# Patient Record
Sex: Female | Born: 1950 | Race: White | Hispanic: No | Marital: Single | Smoking: Never smoker
Health system: Southern US, Community
[De-identification: ages and names within clinical notes are randomized; demographics above are authoritative.]

## PROBLEM LIST (undated history)

## (undated) DIAGNOSIS — E785 Hyperlipidemia, unspecified: Secondary | ICD-10-CM

## (undated) DIAGNOSIS — C4491 Basal cell carcinoma of skin, unspecified: Secondary | ICD-10-CM

## (undated) DIAGNOSIS — R0602 Shortness of breath: Secondary | ICD-10-CM

## (undated) DIAGNOSIS — R221 Localized swelling, mass and lump, neck: Secondary | ICD-10-CM

## (undated) DIAGNOSIS — E559 Vitamin D deficiency, unspecified: Secondary | ICD-10-CM

## (undated) DIAGNOSIS — R030 Elevated blood-pressure reading, without diagnosis of hypertension: Secondary | ICD-10-CM

## (undated) DIAGNOSIS — R195 Other fecal abnormalities: Secondary | ICD-10-CM

## (undated) HISTORY — DX: Elevated blood-pressure reading, without diagnosis of hypertension: R03.0

## (undated) HISTORY — DX: Shortness of breath: R06.02

## (undated) HISTORY — DX: Basal cell carcinoma of skin, unspecified: C44.91

## (undated) HISTORY — DX: Hyperlipidemia, unspecified: E78.5

## (undated) HISTORY — DX: Vitamin D deficiency, unspecified: E55.9

## (undated) HISTORY — DX: Localized swelling, mass and lump, neck: R22.1

## (undated) HISTORY — DX: Other fecal abnormalities: R19.5

---

## 1992-06-20 HISTORY — PX: TUBAL LIGATION: SHX77

## 2005-12-02 DIAGNOSIS — E7800 Pure hypercholesterolemia, unspecified: Secondary | ICD-10-CM | POA: Insufficient documentation

## 2005-12-02 DIAGNOSIS — R0602 Shortness of breath: Secondary | ICD-10-CM

## 2005-12-02 HISTORY — DX: Shortness of breath: R06.02

## 2007-12-03 ENCOUNTER — Ambulatory Visit: Payer: Self-pay | Admitting: Gastroenterology

## 2007-12-03 LAB — HM COLONOSCOPY

## 2011-07-29 LAB — HM MAMMOGRAPHY

## 2012-12-27 DIAGNOSIS — E041 Nontoxic single thyroid nodule: Secondary | ICD-10-CM | POA: Insufficient documentation

## 2012-12-28 ENCOUNTER — Ambulatory Visit: Payer: Self-pay | Admitting: Otolaryngology

## 2013-05-03 LAB — HM PAP SMEAR: HM PAP: NEGATIVE

## 2013-05-09 LAB — TSH: TSH: 0.97 u[IU]/mL (ref 0.41–5.90)

## 2013-05-09 LAB — CBC AND DIFFERENTIAL
HEMATOCRIT: 40 % (ref 36–46)
Hemoglobin: 13.6 g/dL (ref 12.0–16.0)
Platelets: 319 10*3/uL (ref 150–399)
WBC: 3.8 10^3/mL

## 2013-05-09 LAB — BASIC METABOLIC PANEL
BUN: 13 mg/dL (ref 4–21)
CREATININE: 0.9 mg/dL (ref 0.5–1.1)
GLUCOSE: 87 mg/dL
Potassium: 4 mmol/L (ref 3.4–5.3)
Sodium: 143 mmol/L (ref 137–147)

## 2013-10-29 LAB — LIPID PANEL
CHOLESTEROL: 191 mg/dL (ref 0–200)
HDL: 59 mg/dL (ref 35–70)
LDL Cholesterol: 111 mg/dL
TRIGLYCERIDES: 103 mg/dL (ref 40–160)

## 2013-10-29 LAB — HEPATIC FUNCTION PANEL
ALT: 13 U/L (ref 7–35)
AST: 17 U/L (ref 13–35)

## 2014-06-25 ENCOUNTER — Ambulatory Visit: Payer: Self-pay | Admitting: Podiatry

## 2015-03-09 ENCOUNTER — Other Ambulatory Visit: Payer: Self-pay | Admitting: Otolaryngology

## 2015-03-09 DIAGNOSIS — E041 Nontoxic single thyroid nodule: Secondary | ICD-10-CM

## 2015-03-12 ENCOUNTER — Ambulatory Visit
Admission: RE | Admit: 2015-03-12 | Discharge: 2015-03-12 | Disposition: A | Discharge status: Home / Self Care | Payer: 59 | Source: Ambulatory Visit | Attending: Otolaryngology | Admitting: Otolaryngology

## 2015-03-12 DIAGNOSIS — E042 Nontoxic multinodular goiter: Secondary | ICD-10-CM | POA: Insufficient documentation

## 2015-03-12 DIAGNOSIS — E041 Nontoxic single thyroid nodule: Secondary | ICD-10-CM

## 2015-03-24 DIAGNOSIS — Z8619 Personal history of other infectious and parasitic diseases: Secondary | ICD-10-CM | POA: Insufficient documentation

## 2015-03-24 DIAGNOSIS — C4490 Unspecified malignant neoplasm of skin, unspecified: Secondary | ICD-10-CM | POA: Insufficient documentation

## 2015-03-24 DIAGNOSIS — C4491 Basal cell carcinoma of skin, unspecified: Secondary | ICD-10-CM | POA: Insufficient documentation

## 2015-03-24 DIAGNOSIS — E559 Vitamin D deficiency, unspecified: Secondary | ICD-10-CM | POA: Insufficient documentation

## 2015-03-24 DIAGNOSIS — R799 Abnormal finding of blood chemistry, unspecified: Secondary | ICD-10-CM | POA: Insufficient documentation

## 2015-03-24 DIAGNOSIS — R195 Other fecal abnormalities: Secondary | ICD-10-CM | POA: Insufficient documentation

## 2015-03-24 DIAGNOSIS — R030 Elevated blood-pressure reading, without diagnosis of hypertension: Secondary | ICD-10-CM | POA: Insufficient documentation

## 2015-03-24 DIAGNOSIS — R221 Localized swelling, mass and lump, neck: Secondary | ICD-10-CM | POA: Insufficient documentation

## 2015-03-24 DIAGNOSIS — Z0130 Encounter for examination of blood pressure without abnormal findings: Secondary | ICD-10-CM | POA: Insufficient documentation

## 2015-03-27 ENCOUNTER — Ambulatory Visit (INDEPENDENT_AMBULATORY_CARE_PROVIDER_SITE_OTHER): Payer: 59 | Admitting: Physician Assistant

## 2015-03-27 ENCOUNTER — Encounter: Payer: Self-pay | Admitting: Physician Assistant

## 2015-03-27 VITALS — BP 150/88 | HR 68 | Temp 97.8°F | Resp 16 | Ht 63.5 in | Wt 142.0 lb

## 2015-03-27 DIAGNOSIS — Z1239 Encounter for other screening for malignant neoplasm of breast: Secondary | ICD-10-CM

## 2015-03-27 DIAGNOSIS — Z1211 Encounter for screening for malignant neoplasm of colon: Secondary | ICD-10-CM | POA: Diagnosis not present

## 2015-03-27 DIAGNOSIS — E7800 Pure hypercholesterolemia, unspecified: Secondary | ICD-10-CM | POA: Diagnosis not present

## 2015-03-27 DIAGNOSIS — Z0000 Encounter for general adult medical examination without abnormal findings: Secondary | ICD-10-CM | POA: Diagnosis not present

## 2015-03-27 LAB — IFOBT (OCCULT BLOOD): IFOBT: NEGATIVE

## 2015-03-27 NOTE — Progress Notes (Signed)
Patient: Lori Rivers, Female    DOB: [birth year], 63 y.o.   MRN: [id number] Visit Date: 03/27/2015  Today's Provider: Mar Daring, PA-C   Chief Complaint  Patient presents with  . Annual Exam   Subjective:    Annual physical exam Lori Rivers is a 64 y.o. female who presents today for health maintenance and complete physical. She feels well. She reports exercising, walks daily for 20-30 minutes. She reports she is sleeping well, 6-8 hours.  Patient currently not taking any cholesterol medicines. Patient is doing a modified diet, low sugar, very healthy diet. Patient check blood pressure 120/74 30 minutes prior to appointment.  Last PCP: 05/03/14 Pap Smear: 05/03/14 Normal; HPV and G/C Negative Mammogram: Normal 06/03/13 BMD: Normal 06/03/13  There is no personal or family history of cervical or ovarian cancer. Pap smear is to be repeated in 2018. Mammogram was normal in 2014. She is due for repeat annual mammogram. She does perform monthly self breast exams. There is no family history of breast cancer. Last colonoscopy was normal. She is due for colonoscopy in 2019. There is a positive family history of colon cancer in her brother, however is thought to be secondary due to his alcoholism.   Review of Systems  Constitutional: Negative.   HENT: Negative.   Eyes: Negative.   Respiratory: Negative.   Cardiovascular: Negative.   Gastrointestinal: Negative.   Endocrine: Negative.   Genitourinary: Negative.   Musculoskeletal: Negative.   Skin: Negative.   Allergic/Immunologic: Negative.   Neurological: Negative.   Hematological: Negative.   Psychiatric/Behavioral: Negative.     Social History She  reports that she has never smoked. She has never used smokeless tobacco. She reports that she does not drink alcohol or use illicit drugs. Social History   Social History  . Marital Status: Single    Spouse Name: N/A  . Number of Children: 2  . Years of  Education: [employer]   Social History Main Topics  . Smoking status: Never Smoker   . Smokeless tobacco: Never Used  . Alcohol Use: No  . Drug Use: No  . Sexual Activity: Not on file   Other Topics Concern  . Not on file   Social History Narrative  . No narrative on file    Patient Active Problem List   Diagnosis Date Noted  . Avitaminosis D 03/24/2015  . History of chicken pox 03/24/2015  . Encounter for examination of blood pressure without abnormal findings 03/24/2015  . Malignant neoplasm of skin 03/24/2015  . Abnormal blood chemistry 03/24/2015  . Vitamin D deficiency   . Carcinoma of the skin, basal cell   . Blood pressure elevated without history of HTN   . Occult blood positive stool   . Neck nodule   . Thyroid nodule 12/27/2012  . Accumulation of fluid in tissues 09/25/2009  . Short of breath on exertion 12/02/2005  . Hypercholesteremia 12/02/2005    Past Surgical History  Procedure Laterality Date  . Tubal ligation  1994    Family History  Family Status  Relation Status Death Age  . Mother Deceased 84    Cerebrovascular Accident,  . Father Deceased 54   Her family history includes Alcohol abuse in her father and mother; Depression in her mother; Diabetes in her father; Hypertension in her father and mother.    Allergies not on file  Previous Medications   CHOLECALCIFEROL (VITAMIN D3) 5000 UNITS TABS    Take 1 tablet  by mouth daily.   OMEPRAZOLE (PRILOSEC) 10 MG CAPSULE    Take 1 capsule by mouth daily.   SIMVASTATIN (ZOCOR) 20 MG TABLET    Take 1 tablet by mouth at bedtime.    Patient Care Team: Mar Daring, PA-C as PCP - General (Physician Assistant)     Objective:   Vitals: There were no vitals taken for this visit.   Physical Exam  Constitutional: She is oriented to person, place, and time. She appears well-developed and well-nourished. No distress.  HENT:  Head: Normocephalic and atraumatic.  Right Ear: Hearing, tympanic  membrane, external ear and ear canal normal.  Left Ear: Hearing, tympanic membrane, external ear and ear canal normal.  Nose: Nose normal.  Mouth/Throat: Uvula is midline, oropharynx is clear and moist and mucous membranes are normal. No oropharyngeal exudate.  Eyes: Conjunctivae and EOM are normal. Pupils are equal, round, and reactive to light. Right eye exhibits no discharge. Left eye exhibits no discharge. No scleral icterus.  Neck: Normal range of motion. Neck supple. No JVD present. Carotid bruit is not present. No tracheal deviation present. Thyroid mass (stable thyroid nodule-follwed by Dr. Richardson Landry) present. No thyromegaly present.  Cardiovascular: Normal rate, regular rhythm, normal heart sounds and intact distal pulses.  Exam reveals no gallop and no friction rub.   No murmur heard. Pulmonary/Chest: Effort normal and breath sounds normal. No respiratory distress. She has no wheezes. She has no rales. She exhibits no tenderness. Right breast exhibits no inverted nipple, no mass, no nipple discharge, no skin change and no tenderness. Left breast exhibits no inverted nipple, no mass, no nipple discharge, no skin change and no tenderness. Breasts are symmetrical.  Abdominal: Soft. Bowel sounds are normal. She exhibits no distension and no mass. There is no tenderness. There is no rebound and no guarding. Hernia confirmed negative in the right inguinal area and confirmed negative in the left inguinal area.  Genitourinary: Rectum normal, vagina normal and uterus normal. Rectal exam shows no external hemorrhoid, no internal hemorrhoid, no fissure, no mass, no tenderness and anal tone normal. Guaiac negative stool. No breast swelling, tenderness, discharge or bleeding. Pelvic exam was performed with patient supine. There is no rash, tenderness, lesion or injury on the right labia. There is no rash, tenderness, lesion or injury on the left labia. Cervix exhibits no motion tenderness, no discharge and no  friability. Right adnexum displays no mass, no tenderness and no fullness. Left adnexum displays no mass, no tenderness and no fullness. No erythema, tenderness or bleeding in the vagina. No signs of injury around the vagina. No vaginal discharge found.  Musculoskeletal: Normal range of motion. She exhibits no edema or tenderness.  Lymphadenopathy:    She has no cervical adenopathy.       Right: No inguinal adenopathy present.       Left: No inguinal adenopathy present.  Neurological: She is alert and oriented to person, place, and time. She has normal reflexes. No cranial nerve deficit. Coordination normal.  Skin: Skin is warm and dry. No rash noted. She is not diaphoretic.  Psychiatric: She has a normal mood and affect. Her behavior is normal. Judgment and thought content normal.  Vitals reviewed.    Depression Screen No flowsheet data found.    Assessment & Plan:     Routine Health Maintenance and Physical Exam  1. Annual physical exam Physical exam today was normal. We will check labs as below and follow-up pending lab results or in one year  for repeat annual physical exam if labs are normal.she does have a known thyroid nodule. She has had [facility] ultrasound of this. Most recent labs done by Dr. Richardson Landry were normal. - CBC with Differential - Lipid panel - Comprehensive metabolic panel - Mammogram Digital Screening; Future  2. Breast cancer screening Breast exam today was normal. She does perform self breast exams monthly. Order for mammogram has been placed and she is to [facility] her  Annual screening mammogram. - Mammogram Digital Screening; Future  3. Hypercholesteremia History of hypercholesterolemia. Was previously on simvastatin but discontinued. She is trying to control her cholesterol with lifestyle changes. I will recheck her lipid panel and follow-up pending lab results. - Lipid panel  4. Colon cancer screening Normal exam today. OC-Lite was  negative. Repeat screening colonoscopy will be in 2019. - IFOBT POC (occult bld, rslt in office)   Exercise Activities and Dietary recommendations Goals    None      Immunization History  Administered Date(s) Administered  . Tdap 11/02/2007    Health Maintenance  Topic Date Due  . Hepatitis C Screening  1950/12/23  . HIV Screening  06/16/1967  . ZOSTAVAX  06/15/2012  . MAMMOGRAM  07/28/2013  . INFLUENZA VACCINE  01/19/2015  . PAP SMEAR  05/03/2016  . TETANUS/TDAP  11/01/2017  . COLONOSCOPY  12/02/2017      Discussed health benefits of physical activity, and encouraged her to engage in regular exercise appropriate for her age and condition.    --------------------------------------------------------------------

## 2015-03-27 NOTE — Patient Instructions (Signed)
Health Maintenance, Female Adopting a healthy lifestyle and getting preventive care can go a long way to promote health and wellness. Talk with your health care provider about what schedule of regular examinations is right for you. This is a good chance for you to check in with your provider about disease prevention and staying healthy. In between checkups, there are plenty of things you can do on your own. Experts have done a lot of research about which lifestyle changes and preventive measures are most likely to keep you healthy. Ask your health care provider for more information. WEIGHT AND DIET  Eat a healthy diet 1. Be sure to include plenty of vegetables, fruits, low-fat dairy products, and lean protein. 2. Do not eat a lot of foods high in solid fats, added sugars, or salt. 3. Get regular exercise. This is one of the most important things you can do for your health. 1. Most adults should exercise for at least 150 minutes each week. The exercise should increase your heart rate and make you sweat (moderate-intensity exercise). 2. Most adults should also do strengthening exercises at least twice a week. This is in addition to the moderate-intensity exercise.  Maintain a healthy weight 1. Body mass index (BMI) is a measurement that can be used to identify possible weight problems. It estimates body fat based on height and weight. Your health care provider can help determine your BMI and help you achieve or maintain a healthy weight. 2. For females 87 years of age and older:  1. A BMI below 18.5 is considered underweight. 2. A BMI of 18.5 to 24.9 is normal. 3. A BMI of 25 to 29.9 is considered overweight. 4. A BMI of 30 and above is considered obese.  Watch levels of cholesterol and blood lipids 1. You should start having your blood tested for lipids and cholesterol at 63 years of age, then have this test every 5 years. 2. You may need to have your cholesterol levels checked more often  if: 1. Your lipid or cholesterol levels are high. 2. You are older than 63 years of age. 3. You are at high risk for heart disease.  CANCER SCREENING   Lung Cancer 1. Lung cancer screening is recommended for adults 87-65 years old who are at high risk for lung cancer because of a history of smoking. 2. A yearly low-dose CT scan of the lungs is recommended for people who: 1. Currently smoke. 2. Have quit within the past 15 years. 3. Have at least a 30-pack-year history of smoking. A pack year is smoking an average of one pack of cigarettes a day for 1 year. 3. Yearly screening should continue until it has been 15 years since you quit. 4. Yearly screening should stop if you develop a health problem that would prevent you from having lung cancer treatment.  Breast Cancer  Practice breast self-awareness. This means understanding how your breasts normally appear and feel.  It also means doing regular breast self-exams. Let your health care provider know about any changes, no matter how small.  If you are in your 20s or 30s, you should have a clinical breast exam (CBE) by a health care provider every 1-3 years as part of a regular health exam.  If you are 72 or older, have a CBE every year. Also consider having a breast X-ray (mammogram) every year.  If you have a family history of breast cancer, talk to your health care provider about genetic screening.  If you  are at high risk for breast cancer, talk to your health care provider about having an MRI and a mammogram every year.  Breast cancer gene (BRCA) assessment is recommended for women who have family members with BRCA-related cancers. BRCA-related cancers include:  Breast.  Ovarian.  Tubal.  Peritoneal cancers.  Results of the assessment will determine the need for genetic counseling and BRCA1 and BRCA2 testing. Cervical Cancer Your health care provider may recommend that you be screened regularly for cancer of the pelvic  organs (ovaries, uterus, and vagina). This screening involves a pelvic examination, including checking for microscopic changes to the surface of your cervix (Pap test). You may be encouraged to have this screening done every 3 years, beginning at age 23.  For women ages 38-65, health care providers may recommend pelvic exams and Pap testing every 3 years, or they may recommend the Pap and pelvic exam, combined with testing for human papilloma virus (HPV), every 5 years. Some types of HPV increase your risk of cervical cancer. Testing for HPV may also be done on women of any age with unclear Pap test results.  Other health care providers may not recommend any screening for nonpregnant women who are considered low risk for pelvic cancer and who do not have symptoms. Ask your health care provider if a screening pelvic exam is right for you.  If you have had past treatment for cervical cancer or a condition that could lead to cancer, you need Pap tests and screening for cancer for at least 20 years after your treatment. If Pap tests have been discontinued, your risk factors (such as having a new sexual partner) need to be reassessed to determine if screening should resume. Some women have medical problems that increase the chance of getting cervical cancer. In these cases, your health care provider may recommend more frequent screening and Pap tests. Colorectal Cancer  This type of cancer can be detected and often prevented.  Routine colorectal cancer screening usually begins at 63 years of age and continues through 63 years of age.  Your health care provider may recommend screening at an earlier age if you have risk factors for colon cancer.  Your health care provider may also recommend using home test kits to check for hidden blood in the stool.  A small camera at the end of a tube can be used to examine your colon directly (sigmoidoscopy or colonoscopy). This is done to check for the earliest forms  of colorectal cancer.  Routine screening usually begins at age 17.  Direct examination of the colon should be repeated every 5-10 years through 63 years of age. However, you may need to be screened more often if early forms of precancerous polyps or small growths are found. Skin Cancer  Check your skin from head to toe regularly.  Tell your health care provider about any new moles or changes in moles, especially if there is a change in a mole's shape or color.  Also tell your health care provider if you have a mole that is larger than the size of a pencil eraser.  Always use sunscreen. Apply sunscreen liberally and repeatedly throughout the day.  Protect yourself by wearing long sleeves, pants, a wide-brimmed hat, and sunglasses whenever you are outside. HEART DISEASE, DIABETES, AND HIGH BLOOD PRESSURE   High blood pressure causes heart disease and increases the risk of stroke. High blood pressure is more likely to develop in:  People who have blood pressure in the high end  of the normal range (130-139/85-89 mm Hg).  People who are overweight or obese.  People who are African American.  If you are 85-42 years of age, have your blood pressure checked every 3-5 years. If you are 20 years of age or older, have your blood pressure checked every year. You should have your blood pressure measured twice--once when you are at a hospital or clinic, and once when you are not at a hospital or clinic. Record the average of the two measurements. To check your blood pressure when you are not at a hospital or clinic, you can use:  An automated blood pressure machine at a pharmacy.  A home blood pressure monitor.  If you are between 19 years and 53 years old, ask your health care provider if you should take aspirin to prevent strokes.  Have regular diabetes screenings. This involves taking a blood sample to check your fasting blood sugar level.  If you are at a normal weight and have a low risk  for diabetes, have this test once every three years after 63 years of age.  If you are overweight and have a high risk for diabetes, consider being tested at a younger age or more often. PREVENTING INFECTION  Hepatitis B  If you have a higher risk for hepatitis B, you should be screened for this virus. You are considered at high risk for hepatitis B if:  You were born in a country where hepatitis B is common. Ask your health care provider which countries are considered high risk.  Your parents were born in a high-risk country, and you have not been immunized against hepatitis B (hepatitis B vaccine).  You have HIV or AIDS.  You use needles to inject street drugs.  You live with someone who has hepatitis B.  You have had sex with someone who has hepatitis B.  You get hemodialysis treatment.  You take certain medicines for conditions, including cancer, organ transplantation, and autoimmune conditions. Hepatitis C  Blood testing is recommended for:  Everyone born from [birth year] through 1964.  Anyone with known risk factors for hepatitis C. Sexually transmitted infections (STIs)  You should be screened for sexually transmitted infections (STIs) including gonorrhea and chlamydia if:  You are sexually active and are younger than 63 years of age.  You are older than 63 years of age and your health care provider tells you that you are at risk for this type of infection.  Your sexual activity has changed since you were last screened and you are at an increased risk for chlamydia or gonorrhea. Ask your health care provider if you are at risk.  If you do not have HIV, but are at risk, it may be recommended that you take a prescription medicine daily to prevent HIV infection. This is called pre-exposure prophylaxis (PrEP). You are considered at risk if:  You are sexually active and do not regularly use condoms or know the HIV status of your partner(s).  You take drugs by injection.  You  are sexually active with a partner who has HIV. Talk with your health care provider about whether you are at high risk of being infected with HIV. If you choose to begin PrEP, you should first be tested for HIV. You should then be tested every 3 months for as long as you are taking PrEP.  PREGNANCY   If you are premenopausal and you may become pregnant, ask your health care provider about preconception counseling.  If you may  become pregnant, take 400 to 800 micrograms (mcg) of folic acid every day.  If you want to prevent pregnancy, talk to your health care provider about birth control (contraception). OSTEOPOROSIS AND MENOPAUSE   Osteoporosis is a disease in which the bones lose minerals and strength with aging. This can result in serious bone fractures. Your risk for osteoporosis can be identified using a bone density scan.  If you are 65 years of age or older, or if you are at risk for osteoporosis and fractures, ask your health care provider if you should be screened.  Ask your health care provider whether you should take a calcium or vitamin D supplement to lower your risk for osteoporosis.  Menopause may have certain physical symptoms and risks.  Hormone replacement therapy may reduce some of these symptoms and risks. Talk to your health care provider about whether hormone replacement therapy is right for you.  HOME CARE INSTRUCTIONS   Schedule regular health, dental, and eye exams.  Stay current with your immunizations.   Do not use any tobacco products including cigarettes, chewing tobacco, or electronic cigarettes.  If you are pregnant, do not drink alcohol.  If you are breastfeeding, limit how much and how often you drink alcohol.  Limit alcohol intake to no more than 1 drink per day for nonpregnant women. One drink equals 12 ounces of beer, 5 ounces of wine, or 1 ounces of hard liquor.  Do not use street drugs.  Do not share needles.  Ask your health care provider  for help if you need support or information about quitting drugs.  Tell your health care provider if you often feel depressed.  Tell your health care provider if you have ever been abused or do not feel safe at home.   This information is not intended to replace advice given to you by your health care provider. Make sure you discuss any questions you have with your health care provider.   Document Released: 12/20/2010 Document Revised: 06/27/2014 Document Reviewed: 05/08/2013 Elsevier Interactive Patient Education [employer].     Why follow it? Research shows. . Those who follow the Mediterranean diet have a reduced risk of heart disease  . The diet is associated with a reduced incidence of Parkinson's and Alzheimer's diseases . People following the diet may have longer life expectancies and lower rates of chronic diseases  . The Dietary Guidelines for Americans recommends the Mediterranean diet as an eating plan to promote health and prevent disease  What Is the Mediterranean Diet?  . Healthy eating plan based on typical foods and recipes of Mediterranean-style cooking . The diet is primarily a plant based diet; these foods should make up a majority of meals   Starches - Plant based foods should make up a majority of meals - They are an important sources of vitamins, minerals, energy, antioxidants, and fiber - Choose whole grains, foods high in fiber and minimally processed items  - Typical grain sources include wheat, oats, barley, corn, brown rice, bulgar, farro, millet, polenta, couscous  - Various types of beans include chickpeas, lentils, fava beans, black beans, white beans   Fruits  Veggies - Large quantities of antioxidant rich fruits & veggies; 6 or more servings  - Vegetables can be eaten raw or lightly drizzled with oil and cooked  - Vegetables common to the traditional Mediterranean Diet include: artichokes, arugula, beets, broccoli, brussel sprouts, cabbage, carrots,  celery, collard greens, cucumbers, eggplant, kale, leeks, lemons, lettuce, mushrooms, okra, onions,  peas, peppers, potatoes, pumpkin, radishes, rutabaga, shallots, spinach, sweet potatoes, turnips, zucchini - Fruits common to the Mediterranean Diet include: apples, apricots, avocados, cherries, clementines, dates, figs, grapefruits, grapes, melons, nectarines, oranges, peaches, pears, pomegranates, strawberries, tangerines  Fats - Replace butter and margarine with healthy oils, such as olive oil, canola oil, and tahini  - Limit nuts to no more than a handful a day  - Nuts include walnuts, almonds, pecans, pistachios, pine nuts  - Limit or avoid candied, honey roasted or heavily salted nuts - Olives are central to the Mediterranean diet - can be eaten whole or used in a variety of dishes   Meats Protein - Limiting red meat: no more than a few times a month - When eating red meat: choose lean cuts and keep the portion to the size of deck of cards - Eggs: approx. 0 to 4 times a week  - Fish and lean poultry: at least 2 a week  - Healthy protein sources include, chicken, turkey, lean beef, lamb - Increase intake of seafood such as tuna, salmon, trout, mackerel, shrimp, scallops - Avoid or limit high fat processed meats such as sausage and bacon  Dairy - Include moderate amounts of low fat dairy products  - Focus on healthy dairy such as fat free yogurt, skim milk, low or reduced fat cheese - Limit dairy products higher in fat such as whole or 2% milk, cheese, ice cream  Alcohol - Moderate amounts of red wine is ok  - No more than 5 oz daily for women (all ages) and men older than age 65  - No more than 10 oz of wine daily for men younger than 65  Other - Limit sweets and other desserts  - Use herbs and spices instead of salt to flavor foods  - Herbs and spices common to the traditional Mediterranean Diet include: basil, bay leaves, chives, cloves, cumin, fennel, garlic, lavender, marjoram, mint,  oregano, parsley, pepper, rosemary, sage, savory, sumac, tarragon, thyme   It's not just a diet, it's a lifestyle:  . The Mediterranean diet includes lifestyle factors typical of those in the region  . Foods, drinks and meals are best eaten with others and savored . Daily physical activity is important for overall good health . This could be strenuous exercise like running and aerobics . This could also be more leisurely activities such as walking, housework, yard-work, or taking the stairs . Moderation is the key; a balanced and healthy diet accommodates most foods and drinks . Consider portion sizes and frequency of consumption of certain foods   Meal Ideas & Options:  . Breakfast:  o Whole wheat toast or whole wheat English muffins with peanut butter & hard boiled egg o Steel cut oats topped with apples & cinnamon and skim milk  o Fresh fruit: banana, strawberries, melon, berries, peaches  o Smoothies: strawberries, bananas, greek yogurt, peanut butter o Low fat greek yogurt with blueberries and granola  o Egg white omelet with spinach and mushrooms o Breakfast couscous: whole wheat couscous, apricots, skim milk, cranberries  . Sandwiches:  o Hummus and grilled vegetables (peppers, zucchini, squash) on whole wheat bread   o Grilled chicken on whole wheat pita with lettuce, tomatoes, cucumbers or tzatziki  o Tuna salad on whole wheat bread: tuna salad made with greek yogurt, olives, red peppers, capers, green onions o Garlic rosemary lamb pita: lamb sauted with garlic, rosemary, salt & pepper; add lettuce, cucumber, greek yogurt to pita - flavor with   lemon juice and black pepper  . Seafood:  o Mediterranean grilled salmon, seasoned with garlic, basil, parsley, lemon juice and black pepper o Shrimp, lemon, and spinach whole-grain pasta salad made with low fat greek yogurt  o Seared scallops with lemon orzo  o Seared tuna steaks seasoned salt, pepper, coriander topped with tomato  mixture of olives, tomatoes, olive oil, minced garlic, parsley, green onions and cappers  . Meats:  o Herbed greek chicken salad with kalamata olives, cucumber, feta  o Red bell peppers stuffed with spinach, bulgur, lean ground beef (or lentils) & topped with feta   o Kebabs: skewers of chicken, tomatoes, onions, zucchini, squash  o [place] burgers: made with red onions, mint, dill, lemon juice, feta cheese topped with roasted red peppers . Vegetarian o Cucumber salad: cucumbers, artichoke hearts, celery, red onion, feta cheese, tossed in olive oil & lemon juice  o Hummus and whole grain pita points with a greek salad (lettuce, tomato, feta, olives, cucumbers, red onion) o Lentil soup with celery, carrots made with vegetable broth, garlic, salt and pepper  o Tabouli salad: parsley, bulgur, mint, scallions, cucumbers, tomato, radishes, lemon juice, olive oil, salt and pepper.      American Heart Association (AHA) Exercise Recommendation  Being physically active is important to prevent heart disease and stroke, the nation's No. 1and No. 5killers. To improve overall cardiovascular health, we suggest at least 150 minutes per week of moderate exercise or 75 minutes per week of vigorous exercise (or a combination of moderate and vigorous activity). Thirty minutes a day, five times a week is an easy goal to remember. You will also experience benefits even if you divide your time into two or three segments of 10 to 15 minutes per day.  For people who would benefit from lowering their blood pressure or cholesterol, we recommend 40 minutes of aerobic exercise of moderate to vigorous intensity three to four times a week to lower the risk for heart attack and stroke.  Physical activity is anything that makes you move your body and burn calories.  This includes things like climbing stairs or playing sports. Aerobic exercises benefit your heart, and include walking, jogging, swimming or biking. Strength and  stretching exercises are best for overall stamina and flexibility.  The simplest, positive change you can make to effectively improve your heart health is to start walking. It's enjoyable, free, easy, social and great exercise. A walking program is flexible and boasts high success rates because people can stick with it. It's easy for walking to become a regular and satisfying part of life.   For Overall Cardiovascular Health:  At least 30 minutes of moderate-intensity aerobic activity at least 5 days per week for a total of 150  OR   At least 25 minutes of vigorous aerobic activity at least 3 days per week for a total of 75 minutes; or a combination of moderate- and vigorous-intensity aerobic activity  AND   Moderate- to high-intensity muscle-strengthening activity at least 2 days per week for additional health benefits.  For Lowering Blood Pressure and Cholesterol  An average 40 minutes of moderate- to vigorous-intensity aerobic activity 3 or 4 times per week  What if I can't make it to the time goal? Something is always better than nothing! And everyone has to start somewhere. Even if you've been sedentary for years, today is the day you can begin to make healthy changes in your life. If you don't think you'll make it for 30 or  40 minutes, set a reachable goal for today. You can work up toward your overall goal by increasing your time as you get stronger. Don't let all-or-nothing thinking rob you of doing what you can every day.  Source:http://www.heart.org

## 2015-04-18 LAB — CBC WITH DIFFERENTIAL/PLATELET
BASOS ABS: 0 10*3/uL (ref 0.0–0.2)
BASOS: 1 %
EOS (ABSOLUTE): 0.1 10*3/uL (ref 0.0–0.4)
Eos: 3 %
Hematocrit: 38.8 % (ref 34.0–46.6)
Hemoglobin: 13 g/dL (ref 11.1–15.9)
IMMATURE GRANS (ABS): 0 10*3/uL (ref 0.0–0.1)
Immature Granulocytes: 0 %
LYMPHS ABS: 1.3 10*3/uL (ref 0.7–3.1)
Lymphs: 33 %
MCH: 31.2 pg (ref 26.6–33.0)
MCHC: 33.5 g/dL (ref 31.5–35.7)
MCV: 93 fL (ref 79–97)
MONOS ABS: 0.4 10*3/uL (ref 0.1–0.9)
Monocytes: 9 %
NEUTROS ABS: 2.1 10*3/uL (ref 1.4–7.0)
Neutrophils: 54 %
PLATELETS: 272 10*3/uL (ref 150–379)
RBC: 4.17 x10E6/uL (ref 3.77–5.28)
RDW: 13.7 % (ref 12.3–15.4)
WBC: 3.9 10*3/uL (ref 3.4–10.8)

## 2015-04-18 LAB — LIPID PANEL
Chol/HDL Ratio: 3.8 ratio units (ref 0.0–4.4)
Cholesterol, Total: 211 mg/dL — ABNORMAL HIGH (ref 100–199)
HDL: 55 mg/dL (ref >39)
LDL Calculated: 132 mg/dL — ABNORMAL HIGH (ref 0–99)
Triglycerides: 119 mg/dL (ref 0–149)
VLDL CHOLESTEROL CAL: 24 mg/dL (ref 5–40)

## 2015-04-18 LAB — COMPREHENSIVE METABOLIC PANEL
ALBUMIN: 4.3 g/dL (ref 3.6–4.8)
ALK PHOS: 69 IU/L (ref 39–117)
ALT: 10 IU/L (ref 0–32)
AST: 13 IU/L (ref 0–40)
Albumin/Globulin Ratio: 2 (ref 1.1–2.5)
BUN / CREAT RATIO: 17 (ref 11–26)
BUN: 14 mg/dL (ref 8–27)
Bilirubin Total: 0.5 mg/dL (ref 0.0–1.2)
CALCIUM: 9.6 mg/dL (ref 8.7–10.3)
CO2: 29 mmol/L (ref 18–29)
CREATININE: 0.83 mg/dL (ref 0.57–1.00)
Chloride: 101 mmol/L (ref 97–106)
GFR calc Af Amer: 87 mL/min/{1.73_m2} (ref >59)
GFR, EST NON AFRICAN AMERICAN: 76 mL/min/{1.73_m2} (ref >59)
GLUCOSE: 85 mg/dL (ref 65–99)
Globulin, Total: 2.2 g/dL (ref 1.5–4.5)
Potassium: 4.7 mmol/L (ref 3.5–5.2)
Sodium: 144 mmol/L (ref 136–144)
Total Protein: 6.5 g/dL (ref 6.0–8.5)

## 2015-04-20 ENCOUNTER — Telehealth: Payer: Self-pay

## 2015-04-20 NOTE — Telephone Encounter (Signed)
-----   Message from Mar Daring, [place] sent at 04/20/2015  8:45 AM EDT ----- Labs are stable and WNL with exception of cholesterol.  Total cholesterol is borderline elevated at 211 ( like below 200) and LDL is slightly elevated at 132 (like below 100).  Limit high fat, high cholesterol foods.  Will recheck in one year.

## 2015-04-20 NOTE — Telephone Encounter (Signed)
LMTCB  Thanks,  -Merle Whitehorn 

## 2015-04-21 NOTE — Telephone Encounter (Signed)
LMTCB  Thanks,  -Kaho Selle 

## 2015-04-21 NOTE — Telephone Encounter (Signed)
Patient advised as directed below.  Thanks,  -Floreine Kingdon 

## 2015-06-27 IMAGING — US THYROID ULTRASOUND
1 series · 14 of 25 positions shown · non-contrast
Comparison: none

REASON FOR EXAM: Rt Thyroid Nodule
COMMENTS:

PROCEDURE:     BINCA - BINCA SOFT TISSUE HEAD/NECK/THYROI  - December 28, 2012  [DATE]
RESULT:

[Series 1: thyroid ultrasound · 0.08mm/px · 14 of 30 slices shown]
[im 1/30]
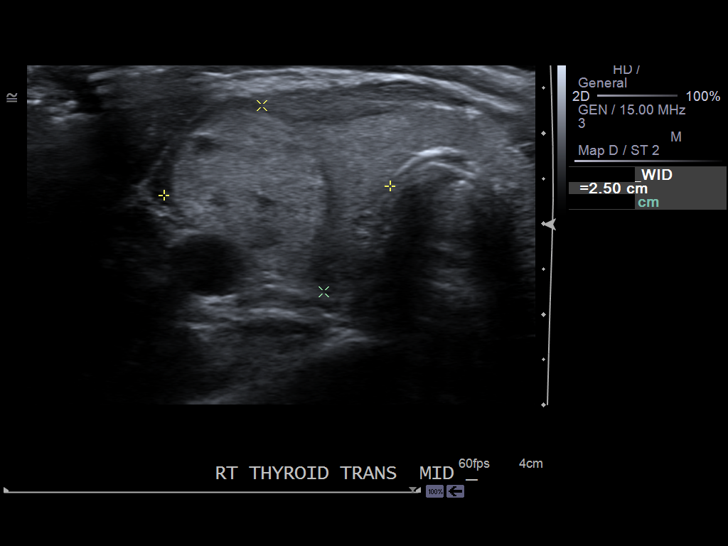
[im 3/30]
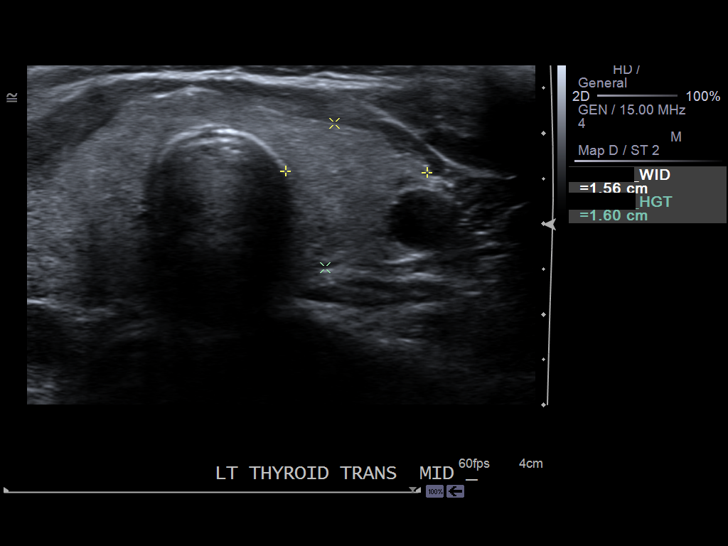
[im 5/30]
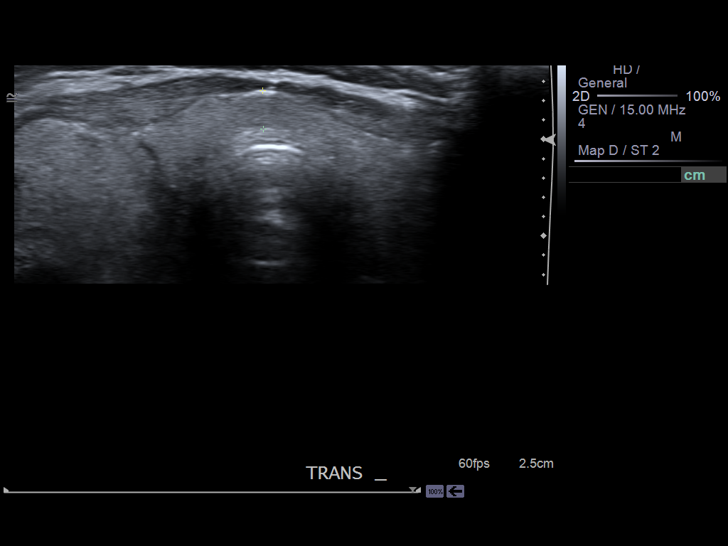
[im 8/30]
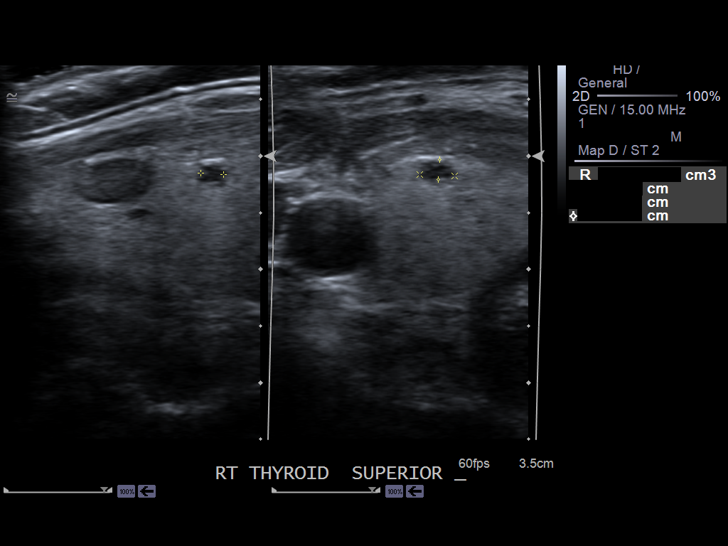
[im 10/30]
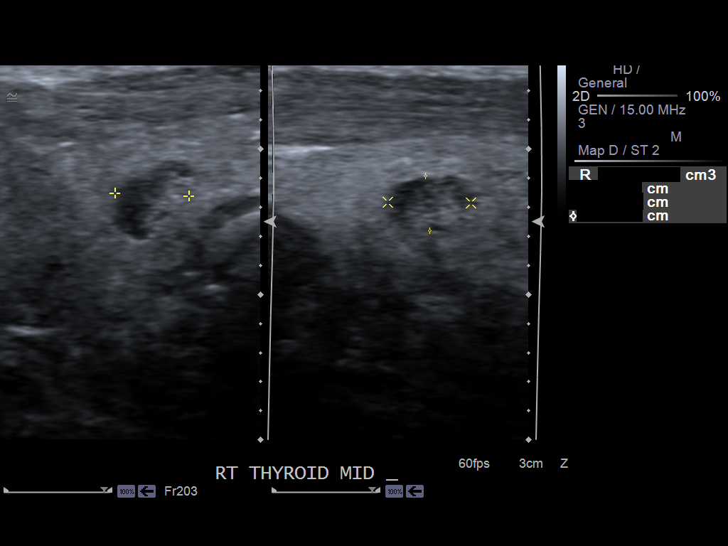
[im 11/30]
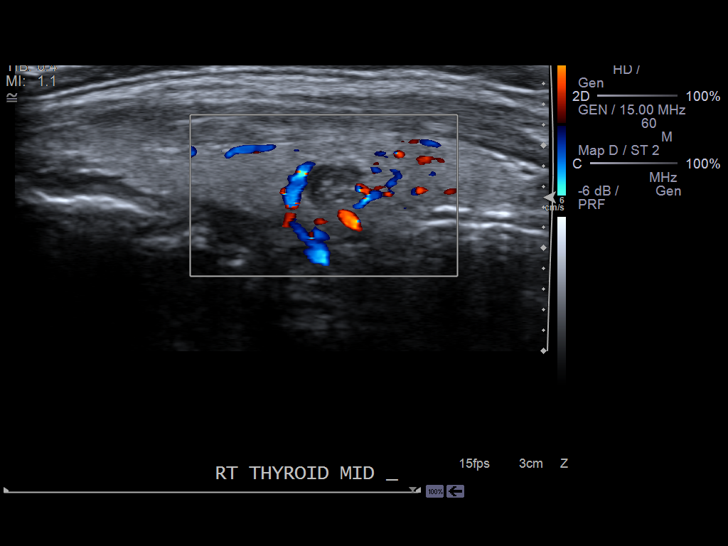
[im 14/30]
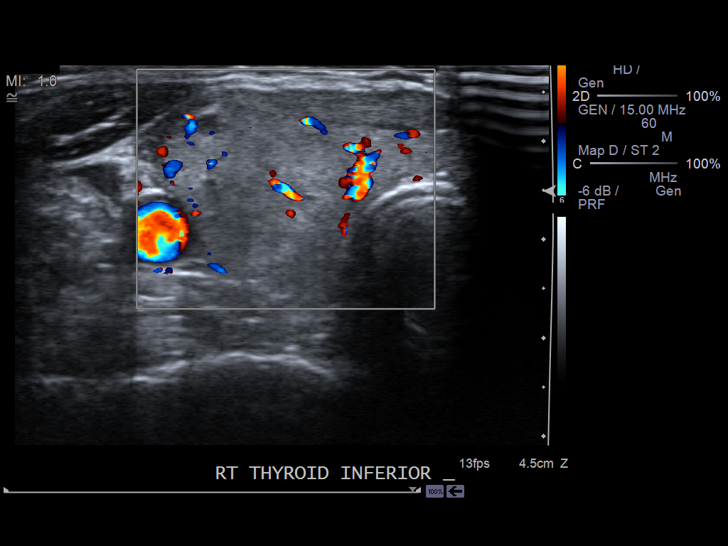
[im 16/30]
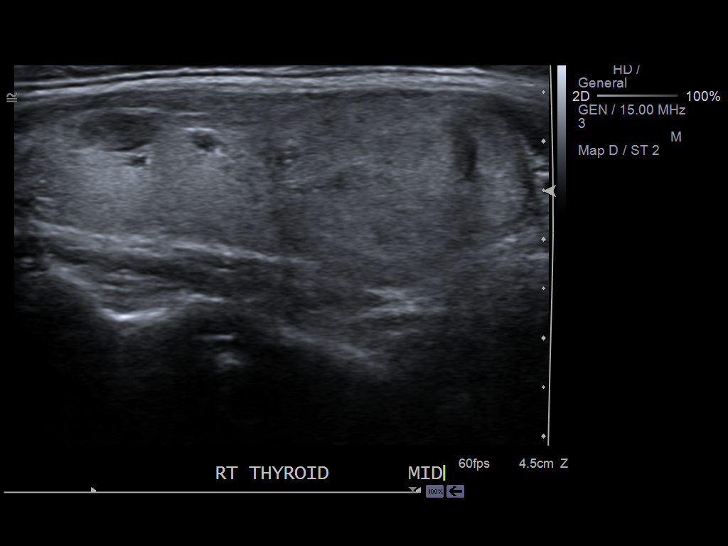
[im 19/30]
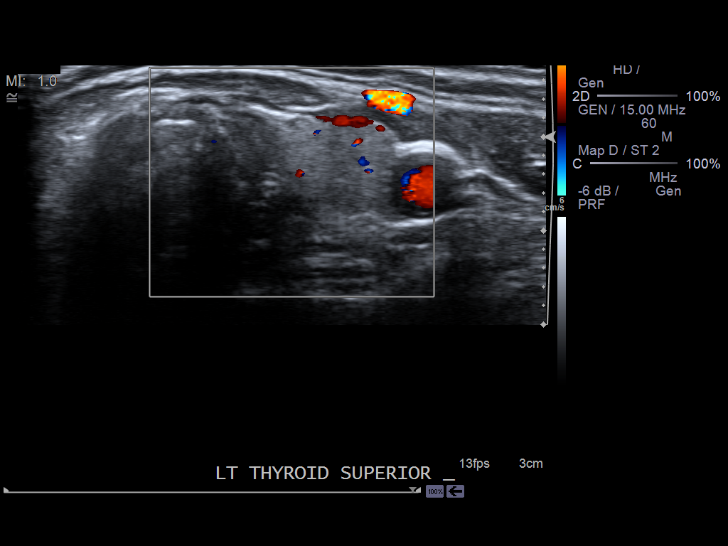
[im 20/30]
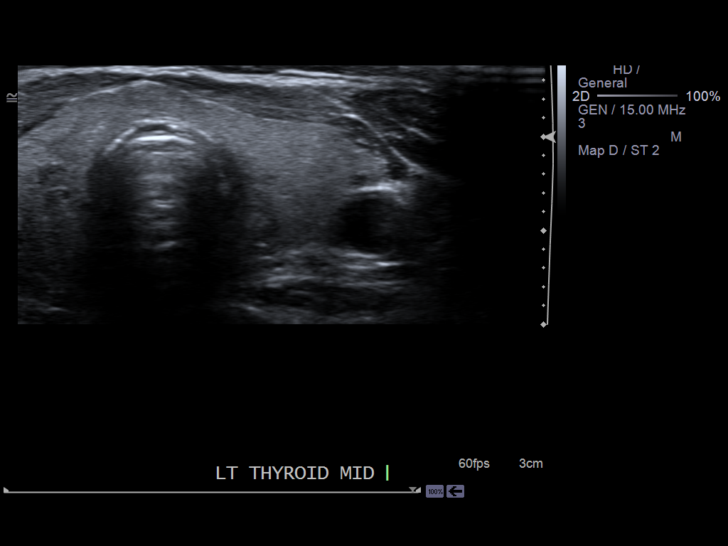
[im 22/30]
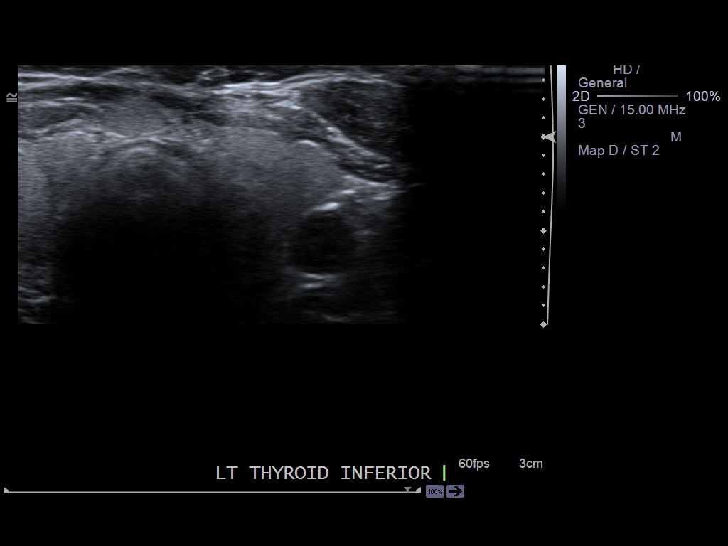
[im 25/30]
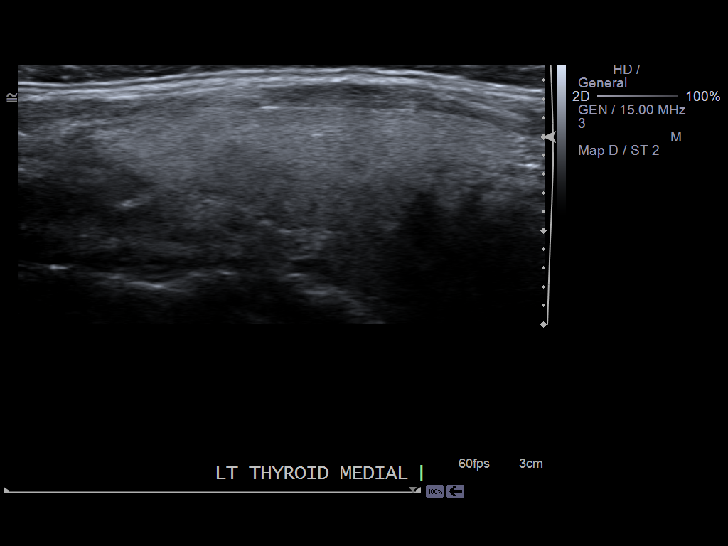
[im 27/30]
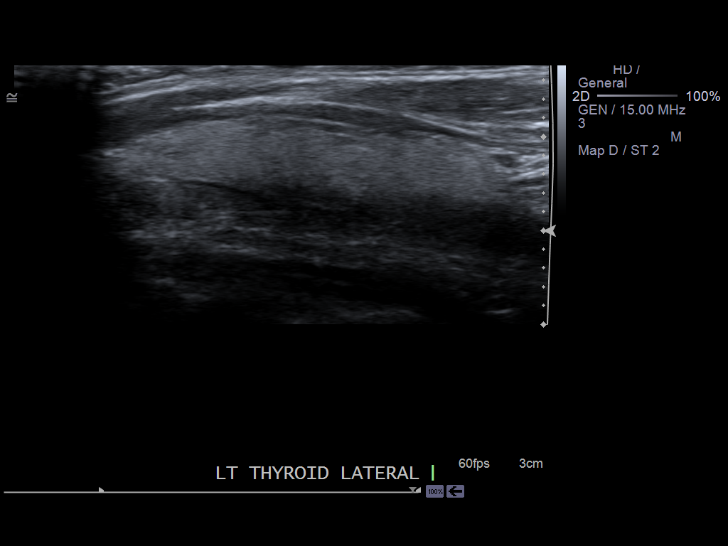
[im 30/30]
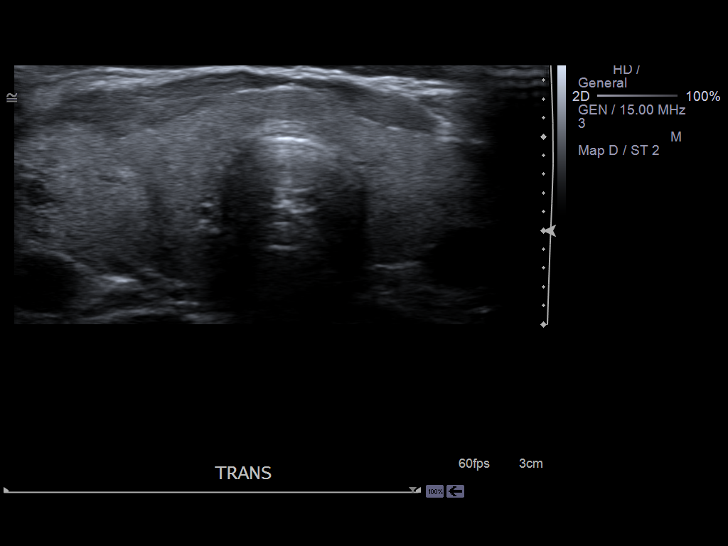

[14 of 25 positions shown; findings below may reference images not displayed]

FINDINGS: The right lobe of the thyroid measures 2.50 x 2.17 x 5.30 cm and
the left 1.56 x 1.60 x 4.47 cm. Isthmus thickness is 0.40 cm. Multiple
nodules are identified within the right lobe of the thyroid with a dominant
nodule within the posterior aspect of the right lobe in an inferior
location. This has a solid appearance and measures 1.78 x 1.79 x
centimeters. A small hypoechoic nodule is identified superiorly within the
right lobe measuring 0.41 x 0.61 x 0.77 cm. A second small cystic nodule is
identified measuring 0.20 x 0.31 x 0.18 cm. A complex appearing nodule is
identified in the medial midportion of the lobe measuring 0.51 x 0.57 x
cm. The left lobe and isthmus otherwise demonstrates a homogeneous
echotexture.
IMPRESSION: 1. Multiple nodules within the right lobe of the thyroid. A dominant nodule
is identified posteriorly. Correlation with thyroid function tests is
recommended. If clinically warranted, tissue sampling, ultrasound-guided of
the dominant nodule can be obtained.

## 2015-07-31 ENCOUNTER — Encounter: Payer: Self-pay | Admitting: Physician Assistant

## 2015-07-31 ENCOUNTER — Ambulatory Visit (INDEPENDENT_AMBULATORY_CARE_PROVIDER_SITE_OTHER): Payer: 59 | Admitting: Physician Assistant

## 2015-07-31 VITALS — BP 170/100 | HR 75 | Temp 97.7°F | Resp 16 | Wt 143.0 lb

## 2015-07-31 DIAGNOSIS — I10 Essential (primary) hypertension: Secondary | ICD-10-CM

## 2015-07-31 MED ORDER — HYDROCHLOROTHIAZIDE 25 MG PO TABS: 25.0000 mg | ORAL_TABLET | Freq: Every day | ORAL | Status: DC

## 2015-07-31 NOTE — Patient Instructions (Signed)
Hypertension Hypertension, commonly called high blood pressure, is when the force of blood pumping through your arteries is too strong. Your arteries are the blood vessels that carry blood from your heart throughout your body. A blood pressure reading consists of a higher number over a lower number, such as 110/72. The higher number (systolic) is the pressure inside your arteries when your heart pumps. The lower number (diastolic) is the pressure inside your arteries when your heart relaxes. Ideally you want your blood pressure below 120/80. Hypertension forces your heart to work harder to pump blood. Your arteries may become narrow or stiff. Having untreated or uncontrolled hypertension can cause heart attack, stroke, kidney disease, and other problems. RISK FACTORS Some risk factors for high blood pressure are controllable. Others are not.  Risk factors you cannot control include:   Race. You may be at higher risk if you are African American.  Age. Risk increases with age.  Gender. Men are at higher risk than women before age 45 years. After age 65, women are at higher risk than men. Risk factors you can control include:  Not getting enough exercise or physical activity.  Being overweight.  Getting too much fat, sugar, calories, or salt in your diet.  Drinking too much alcohol. SIGNS AND SYMPTOMS Hypertension does not usually cause signs or symptoms. Extremely high blood pressure (hypertensive crisis) may cause headache, anxiety, shortness of breath, and nosebleed. DIAGNOSIS To check if you have hypertension, your health care provider will measure your blood pressure while you are seated, with your arm held at the level of your heart. It should be measured at least twice using the same arm. Certain conditions can cause a difference in blood pressure between your right and left arms. A blood pressure reading that is higher than normal on one occasion does not mean that you need treatment. If  it is not clear whether you have high blood pressure, you may be asked to return on a different day to have your blood pressure checked again. Or, you may be asked to monitor your blood pressure at home for 1 or more weeks. TREATMENT Treating high blood pressure includes making lifestyle changes and possibly taking medicine. Living a healthy lifestyle can help lower high blood pressure. You may need to change some of your habits. Lifestyle changes may include:  Following the DASH diet. This diet is high in fruits, vegetables, and whole grains. It is low in salt, red meat, and added sugars.  Keep your sodium intake below 2,300 mg per day.  Getting at least 30-45 minutes of aerobic exercise at least 4 times per week.  Losing weight if necessary.  Not smoking.  Limiting alcoholic beverages.  Learning ways to reduce stress. Your health care provider may prescribe medicine if lifestyle changes are not enough to get your blood pressure under control, and if one of the following is true:  You are 18-59 years of age and your systolic blood pressure is above 140.  You are 60 years of age or older, and your systolic blood pressure is above 150.  Your diastolic blood pressure is above 90.  You have diabetes, and your systolic blood pressure is over 140 or your diastolic blood pressure is over 90.  You have kidney disease and your blood pressure is above 140/90.  You have heart disease and your blood pressure is above 140/90. Your personal target blood pressure may vary depending on your medical conditions, your age, and other factors. HOME CARE INSTRUCTIONS    Have your blood pressure rechecked as directed by your health care provider.   Take medicines only as directed by your health care provider. Follow the directions carefully. Blood pressure medicines must be taken as prescribed. The medicine does not work as well when you skip doses. Skipping doses also puts you at risk for  problems.  Do not smoke.   Monitor your blood pressure at home as directed by your health care provider. SEEK MEDICAL CARE IF:   You think you are having a reaction to medicines taken.  You have recurrent headaches or feel dizzy.  You have swelling in your ankles.  You have trouble with your vision. SEEK IMMEDIATE MEDICAL CARE IF:  You develop a severe headache or confusion.  You have unusual weakness, numbness, or feel faint.  You have severe chest or abdominal pain.  You vomit repeatedly.  You have trouble breathing. MAKE SURE YOU:   Understand these instructions.  Will watch your condition.  Will get help right away if you are not doing well or get worse.   This information is not intended to replace advice given to you by your health care provider. Make sure you discuss any questions you have with your health care provider.   Document Released: 06/06/2005 Document Revised: 10/21/2014 Document Reviewed: 03/29/2013 Elsevier Interactive Patient Education 2016 Elsevier Inc.  

## 2015-07-31 NOTE — Progress Notes (Signed)
       Patient: Lori Rivers Female    DOB: April 15, 1951   65 y.o.   MRN: [id number] Visit Date: 07/31/2015  Today's Provider: Mar Daring, PA-C   Chief Complaint  Patient presents with  . Hypertension   Subjective:    HPI  Lori Rivers is here today concerned that her blood pressure is elevated. Per patient has been checking her blood pressure at home since last office visit where it was borderline elevated.  Last night it was 179/95 and this morning 140/91. She has been having headaches when it gets on the higher end. Last office visit her BP was 150/88.  No medication changes were made at that time. She denies any chest pain, shortness of breath, dyspnea on exertion, lower extremity edema, or visual changes.     No Known Allergies Previous Medications   CHOLECALCIFEROL (VITAMIN D3) 5000 UNITS TABS    Take 1 tablet by mouth daily.    Review of Systems  Constitutional: Negative.   Eyes: Negative for visual disturbance.  Respiratory: Negative.  Negative for chest tightness and shortness of breath.   Cardiovascular: Negative for chest pain, palpitations and leg swelling.  Gastrointestinal: Negative.  Negative for abdominal pain.  Neurological: Positive for headaches. Negative for dizziness and light-headedness.    Social History  Substance Use Topics  . Smoking status: Never Smoker   . Smokeless tobacco: Never Used  . Alcohol Use: No   Objective:   BP 170/100 mmHg  Pulse 75  Temp(Src) 97.7 F (36.5 C) (Oral)  Resp 16  Wt 143 lb (64.864 kg)  Physical Exam  Constitutional: She appears well-developed and well-nourished. No distress.  Cardiovascular: Normal rate, regular rhythm and normal heart sounds.  Exam reveals no gallop and no friction rub.   No murmur heard. Pulmonary/Chest: Effort normal and breath sounds normal. No respiratory distress. She has no wheezes. She has no rales.  Musculoskeletal: Normal range of motion. She exhibits no edema.  Skin: She is  not diaphoretic.  Vitals reviewed.       Assessment & Plan:     1. Essential hypertension Will start with hydrochlorothiazide 25 mg as below. She is to continue taking her blood pressure. I did advise her on how to check her blood pressure at home. 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 She is to call the office in a week or two's time with her blood pressure readings. If blood pressure remains elevated will add lisinopril at that time. We also discussed making sure that she is eating a heart healthy diet which she already adheres to that she is practically vegetarian. We also discussed possibly starting to add physical activity back in to her routine again. She states she used to exercise regularly until she broke her ankle last year. I will see her back in 4 weeks to recheck her blood pressure and see how she is tolerating the medication changes. She is to call the office in the meantime if she has any adverse reactions to the medications, acute issues, questions or concerns. - hydrochlorothiazide (HYDRODIURIL) 25 MG tablet; Take 1 tablet (25 mg total) by mouth daily.  Dispense: 30 tablet; Refill: 0       Mar Daring, PA-C  [facility]

## 2015-08-28 ENCOUNTER — Telehealth: Payer: Self-pay | Admitting: Physician Assistant

## 2015-08-28 ENCOUNTER — Ambulatory Visit: Payer: 59 | Admitting: Physician Assistant

## 2015-08-28 DIAGNOSIS — I10 Essential (primary) hypertension: Secondary | ICD-10-CM

## 2015-08-28 MED ORDER — HYDROCHLOROTHIAZIDE 25 MG PO TABS: 25.0000 mg | ORAL_TABLET | Freq: Every day | ORAL | Status: DC

## 2015-08-28 NOTE — Telephone Encounter (Signed)
Please review, looks like she had appointment for today scheduled but cancelled-aa

## 2015-08-28 NOTE — Telephone Encounter (Signed)
Pt contacted office for refill request on the following medications:  hydrochlorothiazide (HYDRODIURIL) 25 MG tablet.  [place]  CB# [id number]  Pt states she will be out of medication on Sunday.

## 2015-09-01 ENCOUNTER — Ambulatory Visit: Payer: 59 | Admitting: Physician Assistant

## 2015-09-03 ENCOUNTER — Encounter: Payer: Self-pay | Admitting: Physician Assistant

## 2015-09-03 ENCOUNTER — Ambulatory Visit (INDEPENDENT_AMBULATORY_CARE_PROVIDER_SITE_OTHER): Payer: 59 | Admitting: Physician Assistant

## 2015-09-03 VITALS — BP 150/92 | HR 73 | Temp 98.1°F | Resp 14 | Wt 144.4 lb

## 2015-09-03 DIAGNOSIS — I10 Essential (primary) hypertension: Secondary | ICD-10-CM | POA: Diagnosis not present

## 2015-09-03 MED ORDER — HYDROCHLOROTHIAZIDE 25 MG PO TABS: 25.0000 mg | ORAL_TABLET | Freq: Every day | ORAL | Status: DC

## 2015-09-03 NOTE — Patient Instructions (Signed)
DASH Eating Plan  DASH stands for "Dietary Approaches to Stop Hypertension." The DASH eating plan is a healthy eating plan that has been shown to reduce high blood pressure (hypertension). Additional health benefits may include reducing the risk of type 2 diabetes mellitus, heart disease, and stroke. The DASH eating plan may also help with weight loss.  WHAT DO I NEED TO KNOW ABOUT THE DASH EATING PLAN?  For the DASH eating plan, you will follow these general guidelines:  · Choose foods with a percent daily value for sodium of less than 5% (as listed on the food label).  · Use salt-free seasonings or herbs instead of table salt or sea salt.  · Check with your health care provider or pharmacist before using salt substitutes.  · Eat lower-sodium products, often labeled as "lower sodium" or "no salt added."  · Eat fresh foods.  · Eat more vegetables, fruits, and low-fat dairy products.  · Choose whole grains. Look for the word "whole" as the first word in the ingredient list.  · Choose fish and skinless chicken or turkey more often than red meat. Limit fish, poultry, and meat to 6 oz (170 g) each day.  · Limit sweets, desserts, sugars, and sugary drinks.  · Choose heart-healthy fats.  · Limit cheese to 1 oz (28 g) per day.  · Eat more home-cooked food and less restaurant, buffet, and fast food.  · Limit fried foods.  · Cook foods using methods other than frying.  · Limit canned vegetables. If you do use them, rinse them well to decrease the sodium.  · When eating at a restaurant, ask that your food be prepared with less salt, or no salt if possible.  WHAT FOODS CAN I EAT?  Seek help from a dietitian for individual calorie needs.  Grains  Whole grain or whole wheat bread. Brown rice. Whole grain or whole wheat pasta. Quinoa, bulgur, and whole grain cereals. Low-sodium cereals. Corn or whole wheat flour tortillas. Whole grain cornbread. Whole grain crackers. Low-sodium crackers.  Vegetables  Fresh or frozen vegetables  (raw, steamed, roasted, or grilled). Low-sodium or reduced-sodium tomato and vegetable juices. Low-sodium or reduced-sodium tomato sauce and paste. Low-sodium or reduced-sodium canned vegetables.   Fruits  All fresh, canned (in natural juice), or frozen fruits.  Meat and Other Protein Products  Ground beef (85% or leaner), grass-fed beef, or beef trimmed of fat. Skinless chicken or turkey. Ground chicken or turkey. Pork trimmed of fat. All fish and seafood. Eggs. Dried beans, peas, or lentils. Unsalted nuts and seeds. Unsalted canned beans.  Dairy  Low-fat dairy products, such as skim or 1% milk, 2% or reduced-fat cheeses, low-fat ricotta or cottage cheese, or plain low-fat yogurt. Low-sodium or reduced-sodium cheeses.  Fats and Oils  Tub margarines without trans fats. Light or reduced-fat mayonnaise and salad dressings (reduced sodium). Avocado. Safflower, olive, or canola oils. Natural peanut or almond butter.  Other  Unsalted popcorn and pretzels.  The items listed above may not be a complete list of recommended foods or beverages. Contact your dietitian for more options.  WHAT FOODS ARE NOT RECOMMENDED?  Grains  White bread. White pasta. White rice. Refined cornbread. Bagels and croissants. Crackers that contain trans fat.  Vegetables  Creamed or fried vegetables. Vegetables in a cheese sauce. Regular canned vegetables. Regular canned tomato sauce and paste. Regular tomato and vegetable juices.  Fruits  Dried fruits. Canned fruit in light or heavy syrup. Fruit juice.  Meat and Other Protein   Products  Fatty cuts of meat. Ribs, chicken wings, bacon, sausage, bologna, salami, chitterlings, fatback, hot dogs, bratwurst, and packaged luncheon meats. Salted nuts and seeds. Canned beans with salt.  Dairy  Whole or 2% milk, cream, half-and-half, and cream cheese. Whole-fat or sweetened yogurt. Full-fat cheeses or blue cheese. Nondairy creamers and whipped toppings. Processed cheese, cheese spreads, or cheese  curds.  Condiments  Onion and garlic salt, seasoned salt, table salt, and sea salt. Canned and packaged gravies. Worcestershire sauce. Tartar sauce. Barbecue sauce. Teriyaki sauce. Soy sauce, including reduced sodium. Steak sauce. Fish sauce. Oyster sauce. Cocktail sauce. Horseradish. Ketchup and mustard. Meat flavorings and tenderizers. Bouillon cubes. Hot sauce. Tabasco sauce. Marinades. Taco seasonings. Relishes.  Fats and Oils  Butter, stick margarine, lard, shortening, ghee, and bacon fat. Coconut, palm kernel, or palm oils. Regular salad dressings.  Other  Pickles and olives. Salted popcorn and pretzels.  The items listed above may not be a complete list of foods and beverages to avoid. Contact your dietitian for more information.  WHERE CAN I FIND MORE INFORMATION?  National Heart, Lung, and Blood Institute: www.nhlbi.nih.gov/health/health-topics/topics/dash/     This information is not intended to replace advice given to you by your health care provider. Make sure you discuss any questions you have with your health care provider.     Document Released: 05/26/2011 Document Revised: 06/27/2014 Document Reviewed: 04/10/2013  Elsevier Interactive Patient Education ©2016 Elsevier Inc.

## 2015-09-03 NOTE — Progress Notes (Signed)
Patient ID: Lori Rivers, female   DOB: [birth year], 65 y.o.   MRN: [id number]   Patient: Lori Rivers Female    DOB: Jun 17, 1951   65 y.o.   MRN: [id number] Visit Date: 09/03/2015  Today's Provider: Mar Daring, PA-C   Chief Complaint  Patient presents with  . Hypertension  . Follow-up   Subjective:    HPI  Hypertension, follow-up:  BP Readings from Last 3 Encounters:  07/31/15 170/100  03/27/15 150/88  06/18/14 148/90    She was last seen for hypertension 4 weeks ago.  BP at that visit was 170/100. Management changes since that visit include added HCTZ 25 mg at last OV. She reports excellent compliance with treatment. She is not having side effects.  She is exercising. She is adherent to low salt diet.   Outside blood pressures are being checked ranging 130's-140's/70's-80's. She is experiencing none.  Patient denies none.   Cardiovascular risk factors include advanced age (older than 32 for men, 73 for women).  Use of agents associated with hypertension: none.     Weight trend: stable Wt Readings from Last 3 Encounters:  07/31/15 143 lb (64.864 kg)  03/27/15 142 lb (64.411 kg)    Current diet: well balanced  ------------------------------------------------------------------------     Previous Medications   CHOLECALCIFEROL (VITAMIN D3) 5000 UNITS TABS    Take 1 tablet by mouth daily.   HYDROCHLOROTHIAZIDE (HYDRODIURIL) 25 MG TABLET    Take 1 tablet (25 mg total) by mouth daily.   No Known Allergies  Review of Systems  Constitutional: Negative.   HENT: Negative.   Eyes: Negative.   Respiratory: Negative.   Cardiovascular: Negative.   Gastrointestinal: Negative.   Endocrine: Negative.   Genitourinary: Negative.   Musculoskeletal: Negative.   Skin: Negative.   Allergic/Immunologic: Negative.   Neurological: Negative.   Hematological: Negative.   Psychiatric/Behavioral: Negative.     Social History  Substance Use Topics  . Smoking status:  Never Smoker   . Smokeless tobacco: Never Used  . Alcohol Use: No   Objective:   There were no vitals taken for this visit.  Physical Exam  Constitutional: She appears well-developed and well-nourished. No distress.  Cardiovascular: Normal rate, regular rhythm and normal heart sounds.  Exam reveals no gallop and no friction rub.   No murmur heard. Pulmonary/Chest: Effort normal and breath sounds normal. No respiratory distress. She has no wheezes. She has no rales.  Skin: She is not diaphoretic.  Vitals reviewed.       Assessment & Plan:     1. Essential hypertension Improved and better controlled at home with HCTZ 25mg . Elevated today due to stress at work.  Will continue HCTZ as prescribed and refilled as below. She is going to continue to monitor her BP at home and will call if BP starts to elevate. She is to call the office in 6 months and we will recheck labs at that time (CBC, CMP). She also may require her mammogram at that time since that last I have found on file was from 04/2013, however she states she had one at [facility] last year. She will be due for her pap in 04/2016 as well. Colonoscopy and BMD are not due until 2019. - hydrochlorothiazide (HYDRODIURIL) 25 MG tablet; Take 1 tablet (25 mg total) by mouth daily.  Dispense: 30 tablet; Refill: 11   Follow up: No Follow-up on file.

## 2015-09-25 ENCOUNTER — Other Ambulatory Visit: Payer: Self-pay | Admitting: Physician Assistant

## 2015-09-25 DIAGNOSIS — I10 Essential (primary) hypertension: Secondary | ICD-10-CM

## 2015-09-25 DIAGNOSIS — R609 Edema, unspecified: Secondary | ICD-10-CM

## 2015-09-25 MED ORDER — HYDROCHLOROTHIAZIDE 25 MG PO TABS: 25.0000 mg | ORAL_TABLET | Freq: Every day | ORAL | Status: DC

## 2016-02-01 ENCOUNTER — Telehealth: Payer: Self-pay

## 2016-02-01 DIAGNOSIS — I10 Essential (primary) hypertension: Secondary | ICD-10-CM

## 2016-02-01 DIAGNOSIS — E7800 Pure hypercholesterolemia, unspecified: Secondary | ICD-10-CM

## 2016-02-01 DIAGNOSIS — E041 Nontoxic single thyroid nodule: Secondary | ICD-10-CM

## 2016-02-01 NOTE — Telephone Encounter (Signed)
These were ordered as well.

## 2016-02-01 NOTE — Telephone Encounter (Signed)
Patient was notified. Patient also wanted to know if you can check lipids and TSH?

## 2016-02-01 NOTE — Telephone Encounter (Signed)
Patient was advised 09/03/2015 to call the office in 6 months and we will recheck labs at that time (CBC, CMP).

## 2016-02-01 NOTE — Telephone Encounter (Signed)
Pt requesting lab slip. Renaldo Fiddler, CMA

## 2016-02-01 NOTE — Telephone Encounter (Signed)
Orders have been placed. She may go to [facility] and I will call with results once I receive them.

## 2016-02-09 LAB — CBC WITH DIFFERENTIAL/PLATELET
BASOS ABS: 0 10*3/uL (ref 0.0–0.2)
BASOS: 1 %
EOS (ABSOLUTE): 0.1 10*3/uL (ref 0.0–0.4)
Eos: 3 %
Hematocrit: 36.8 % (ref 34.0–46.6)
Hemoglobin: 12.2 g/dL (ref 11.1–15.9)
IMMATURE GRANS (ABS): 0 10*3/uL (ref 0.0–0.1)
IMMATURE GRANULOCYTES: 0 %
LYMPHS: 37 %
Lymphocytes Absolute: 1.4 10*3/uL (ref 0.7–3.1)
MCH: 30.7 pg (ref 26.6–33.0)
MCHC: 33.2 g/dL (ref 31.5–35.7)
MCV: 93 fL (ref 79–97)
MONOS ABS: 0.3 10*3/uL (ref 0.1–0.9)
Monocytes: 8 %
NEUTROS PCT: 51 %
Neutrophils Absolute: 1.9 10*3/uL (ref 1.4–7.0)
PLATELETS: 317 10*3/uL (ref 150–379)
RBC: 3.97 x10E6/uL (ref 3.77–5.28)
RDW: 13.7 % (ref 12.3–15.4)
WBC: 3.6 10*3/uL (ref 3.4–10.8)

## 2016-02-09 LAB — COMPREHENSIVE METABOLIC PANEL
ALT: 10 IU/L (ref 0–32)
AST: 17 IU/L (ref 0–40)
Albumin/Globulin Ratio: 2 (ref 1.2–2.2)
Albumin: 4.5 g/dL (ref 3.6–4.8)
Alkaline Phosphatase: 72 IU/L (ref 39–117)
BUN/Creatinine Ratio: 11 — ABNORMAL LOW (ref 12–28)
BUN: 10 mg/dL (ref 8–27)
Bilirubin Total: 0.5 mg/dL (ref 0.0–1.2)
CALCIUM: 10.2 mg/dL (ref 8.7–10.3)
CO2: 28 mmol/L (ref 18–29)
Chloride: 102 mmol/L (ref 96–106)
Creatinine, Ser: 0.91 mg/dL (ref 0.57–1.00)
GFR, EST AFRICAN AMERICAN: 78 mL/min/{1.73_m2} (ref >59)
GFR, EST NON AFRICAN AMERICAN: 67 mL/min/{1.73_m2} (ref >59)
GLUCOSE: 83 mg/dL (ref 65–99)
Globulin, Total: 2.2 g/dL (ref 1.5–4.5)
POTASSIUM: 3.9 mmol/L (ref 3.5–5.2)
Sodium: 145 mmol/L — ABNORMAL HIGH (ref 134–144)
TOTAL PROTEIN: 6.7 g/dL (ref 6.0–8.5)

## 2016-02-09 LAB — TSH: TSH: 1.06 u[IU]/mL (ref 0.450–4.500)

## 2016-02-09 LAB — LIPID PANEL
CHOL/HDL RATIO: 4.1 ratio (ref 0.0–4.4)
Cholesterol, Total: 203 mg/dL — ABNORMAL HIGH (ref 100–199)
HDL: 50 mg/dL (ref >39)
LDL CALC: 128 mg/dL — AB (ref 0–99)
Triglycerides: 127 mg/dL (ref 0–149)
VLDL CHOLESTEROL CAL: 25 mg/dL (ref 5–40)

## 2016-02-11 ENCOUNTER — Telehealth: Payer: Self-pay

## 2016-02-11 NOTE — Telephone Encounter (Signed)
-----   Message from Mar Daring, [place] sent at 02/11/2016 12:30 PM EDT ----- All labs are within normal limits and stable.  Thanks! -JB

## 2016-02-11 NOTE — Telephone Encounter (Signed)
Patient advised as directed below. MyChart code emailed to patient per patient's request.

## 2016-02-19 ENCOUNTER — Telehealth: Payer: Self-pay

## 2016-02-19 NOTE — Telephone Encounter (Signed)
Patient advised as directed below.  Thanks,  -Joseline 

## 2016-02-19 NOTE — Telephone Encounter (Signed)
LMTCB  Thanks,  -Aubrea Meixner 

## 2016-02-19 NOTE — Telephone Encounter (Signed)
-----   Message from Mar Daring, [place] sent at 02/19/2016 12:14 PM EDT ----- All labs are within normal limits and stable.  Cholesterol slightly improved. Continue lifestyle modifications. Thanks! -JB

## 2016-04-14 IMAGING — US US SOFT TISSUE HEAD/NECK
1 series · 13 of 25 positions shown · non-contrast
Comparison: Thyroid ultrasound - 12/28/2012

CLINICAL DATA: Follow-up thyroid nodules. History of benign thyroid
biopsy (3983)

EXAM:
THYROID ULTRASOUND
TECHNIQUE: Ultrasound examination of the thyroid gland and adjacent soft
tissues was performed.

[Series 1: us soft tissue head/neck · 0.07mm/px · 13 of 63 slices shown]
[im 1/63]
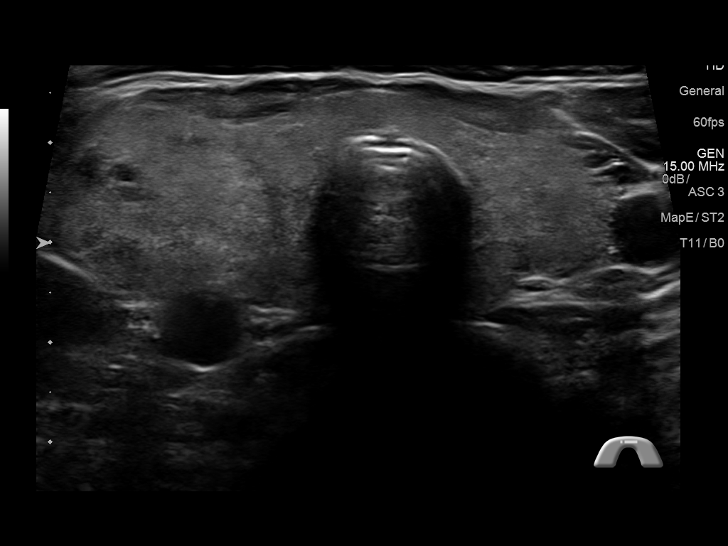
[im 6/63]
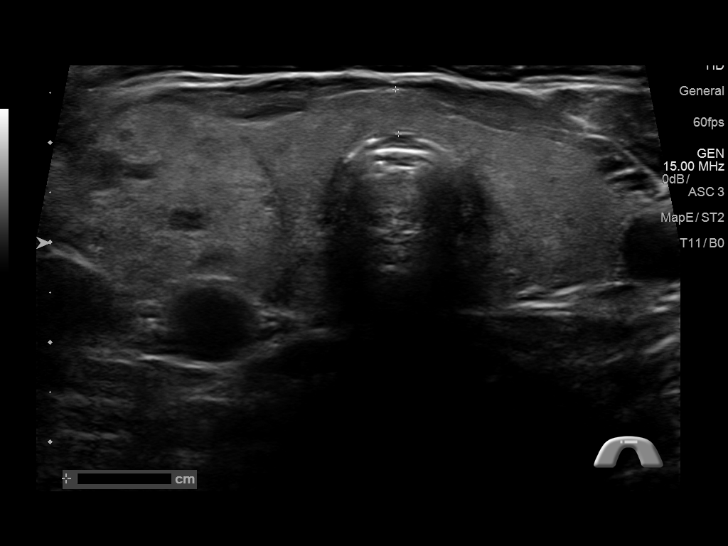
[im 11/63]
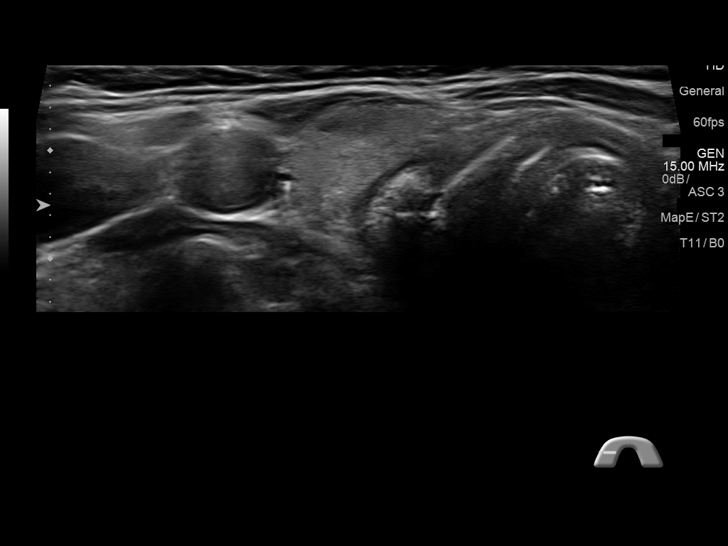
[im 16/63]
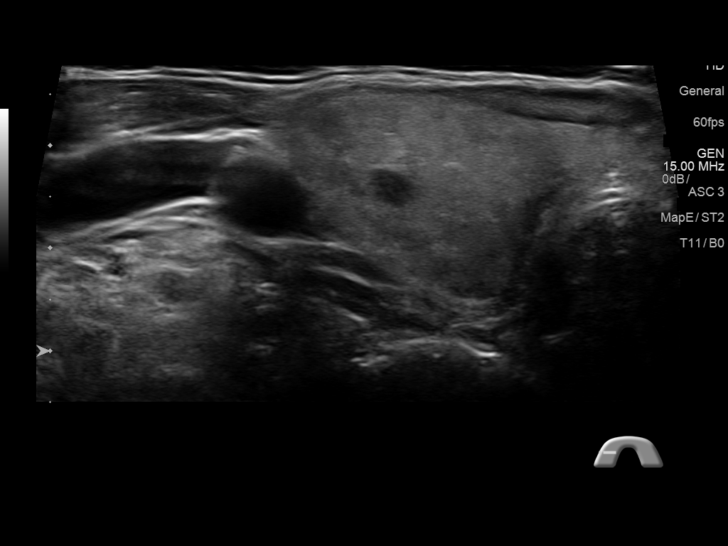
[im 21/63]
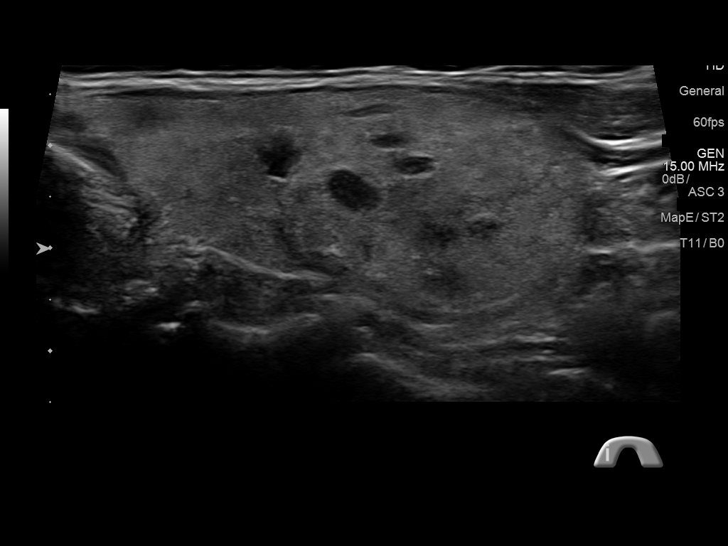
[im 26/63]
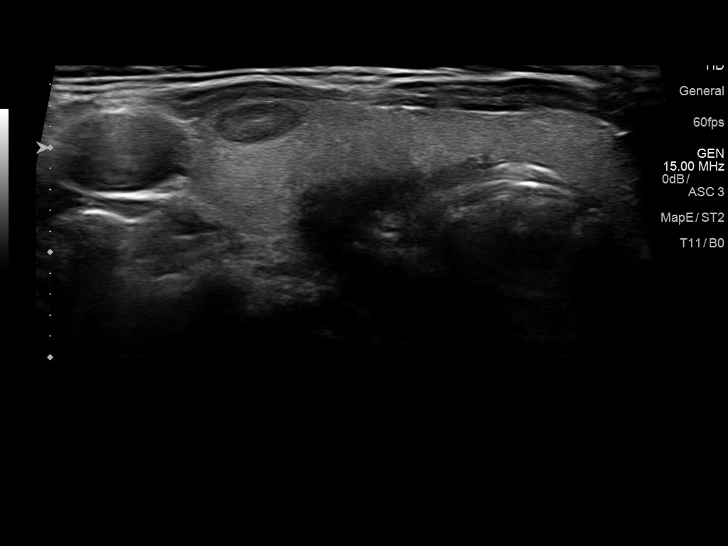
[im 32/63]
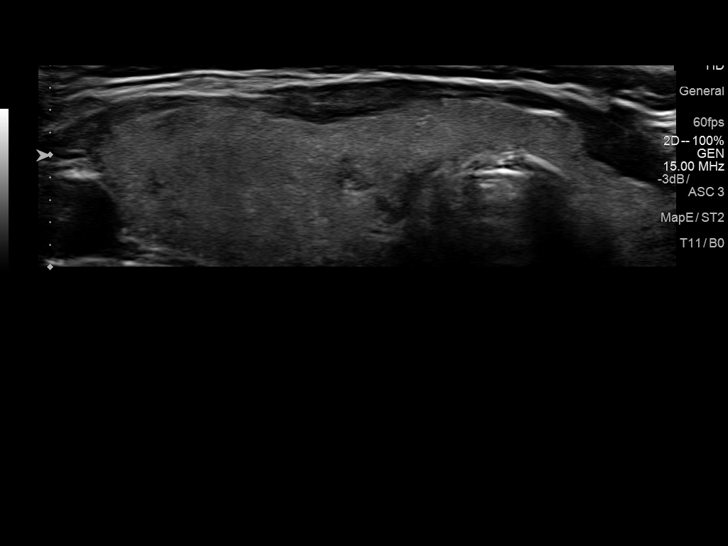
[im 37/63]
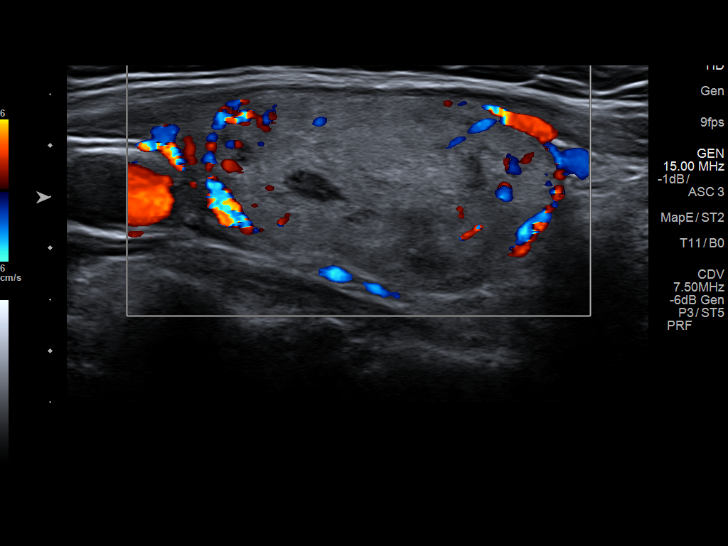
[im 42/63]
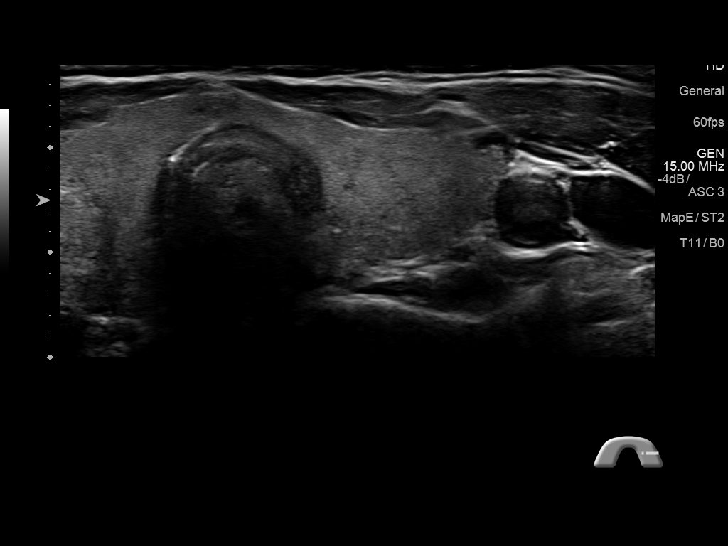
[im 47/63]
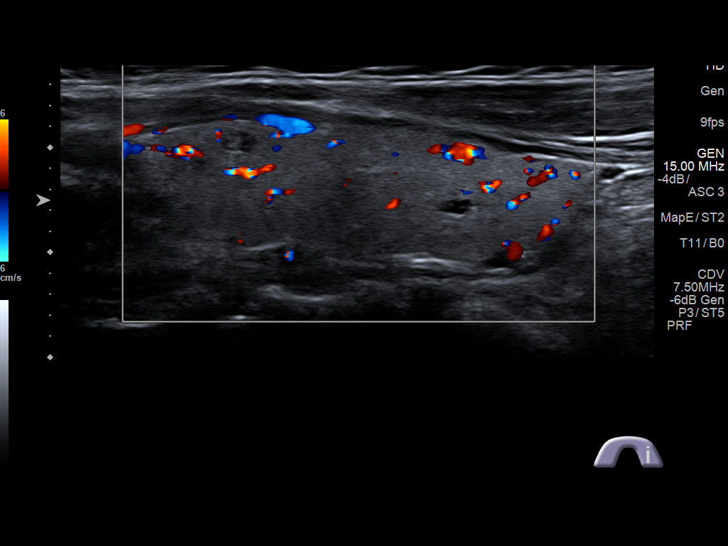
[im 52/63]
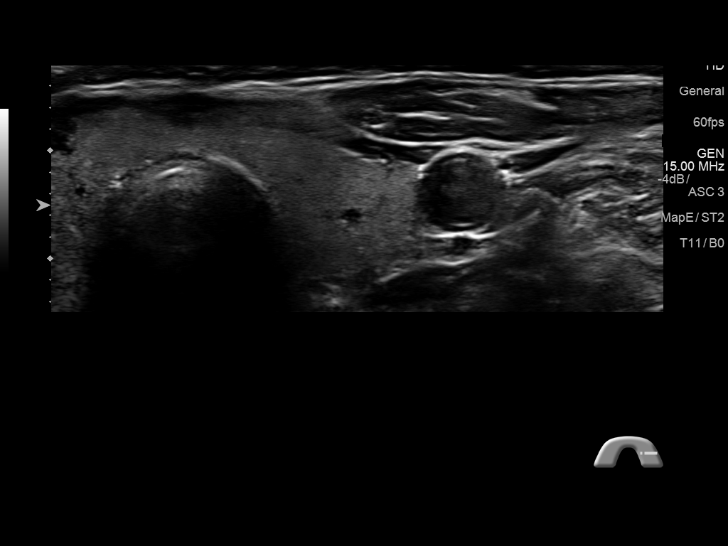
[im 57/63]
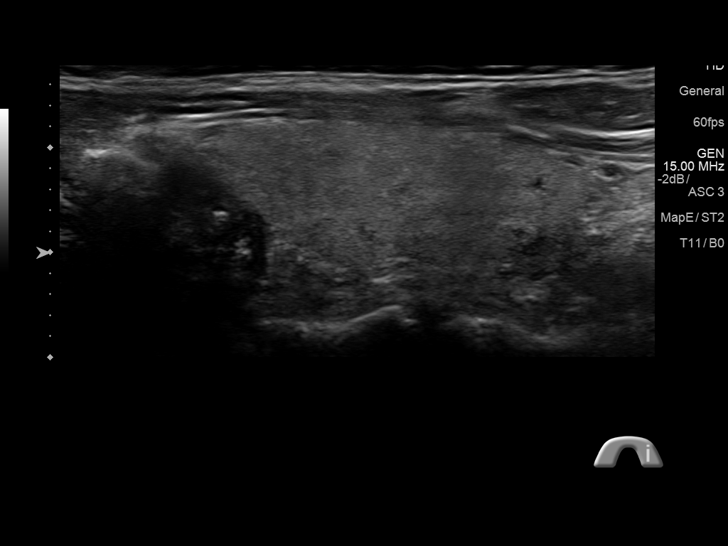
[im 63/63]
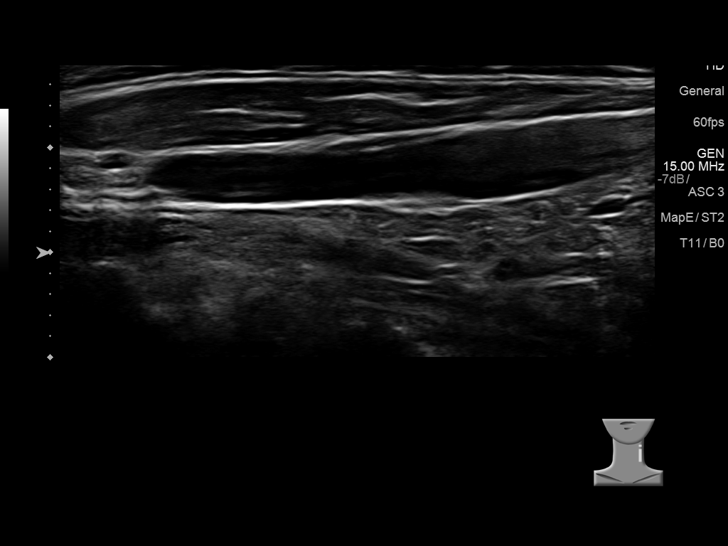

[13 of 25 positions shown; findings below may reference images not displayed]

FINDINGS: Right thyroid lobe

Measurements: Borderline enlarged measuring 5.5 x 1.8 x 2.8 cm,
unchanged, previously, 5.3 x 2.5 x 2.2 cm.

Right, superior (labeled #1) - 0.8 x 0.3 x 0.8 cm - mixed echogenic,
solid - grossly unchanged, previously, 0.6 x 0.4 x 0.8 cm

Right, mid (labeled #2) - 0.5 x 0.3 x 0.4 cm - mixed echogenic,
solid, ill-defined, potentially pseudo nodule - grossly unchanged,
previously 0.5 x 0.6 x 0.4 cm

Right, inferior (labeled #3) - 3.2 x 2.0 x 2.5 cm - mixed echogenic,
partially cystic, predominantly solid - increased in size in the
interval, previously, 1.8 x 1.8 x 2.8 cm

Left thyroid lobe

Measurements: Normal in size measuring 4.4 x 1.2 x 1.8 cm.

There is mild diffuse heterogeneity the left lobe of the thyroid
without discrete nodule or mass.

Isthmus

Thickness: Normal in size measuring 0.5 cm in diameter.

No discrete nodule or mass is identified within the thyroid
isthmus..

Lymphadenopathy

None visualized.
IMPRESSION: 1. Findings suggestive of multi nodular goiter.
2. Interval increase in size of the now approximately 3.2 cm
nodule/mass within the inferior aspect the right lobe of the
thyroid, previously, 2.8 cm. By report, this nodule was previously
biopsied and as such, correlation with prior biopsy results is
recommended. Given interval growth, technically this nodule meets
imaging criteria for repeat ultrasound-guided biopsy, though if
indeed this nodule was previously biopsied with benign pathology, an
additional imaging strategy could entail a follow-up thyroid
ultrasound in the next 6-12 months as clinically indicated.

## 2016-06-08 ENCOUNTER — Ambulatory Visit: Payer: 59 | Admitting: Physician Assistant

## 2016-06-23 ENCOUNTER — Telehealth: Payer: Self-pay

## 2016-06-23 DIAGNOSIS — J069 Acute upper respiratory infection, unspecified: Secondary | ICD-10-CM

## 2016-06-23 DIAGNOSIS — R059 Cough, unspecified: Secondary | ICD-10-CM

## 2016-06-23 DIAGNOSIS — R05 Cough: Secondary | ICD-10-CM

## 2016-06-23 MED ORDER — BENZONATATE 200 MG PO CAPS: 200.0000 mg | ORAL_CAPSULE | Freq: Three times a day (TID) | ORAL | 0 refills | Status: AC | PRN

## 2016-06-23 MED ORDER — AZITHROMYCIN 250 MG PO TABS: ORAL_TABLET | ORAL | 0 refills | Status: AC

## 2016-06-23 NOTE — Telephone Encounter (Signed)
Pt called c/o dry cough x 3 weeks. Is unchanged. Afebrile. 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 Is requesting to be treated without being seen, but does understand if she needs an OV. SpO2 is 99%. Not wheezing. [place] Renaldo Fiddler, CMA

## 2016-06-23 NOTE — Telephone Encounter (Signed)
Spoke with patient on the phone and will send in zpak and tessalon perles. Patient aware

## 2016-06-30 ENCOUNTER — Telehealth: Payer: Self-pay | Admitting: Physician Assistant

## 2016-06-30 DIAGNOSIS — Z20828 Contact with and (suspected) exposure to other viral communicable diseases: Secondary | ICD-10-CM

## 2016-06-30 MED ORDER — OSELTAMIVIR PHOSPHATE 75 MG PO CAPS: 75.0000 mg | ORAL_CAPSULE | Freq: Every day | ORAL | 0 refills | Status: AC

## 2016-06-30 MED ORDER — OSELTAMIVIR PHOSPHATE 75 MG PO CAPS: 75.0000 mg | ORAL_CAPSULE | Freq: Every day | ORAL | 0 refills | Status: DC

## 2016-06-30 NOTE — Telephone Encounter (Signed)
Tamiflu sent to [facility]  Can we call [facility] and cancel the tamiflu Rx I accidentally sent there.

## 2016-06-30 NOTE — Telephone Encounter (Signed)
Please Review.  Thanks,  -Dawnielle Christiana 

## 2016-06-30 NOTE — Telephone Encounter (Signed)
Pt called saying she was exposed to some one who has been diagnosed with the flu.  She has not ever had a flu shot.  She would like to get a rx for tamilflu.  She uses [facility]  Thank sTeri

## 2016-07-01 NOTE — Telephone Encounter (Signed)
Advised  ED 

## 2016-08-10 ENCOUNTER — Other Ambulatory Visit: Payer: Self-pay | Admitting: Physician Assistant

## 2016-08-10 DIAGNOSIS — I10 Essential (primary) hypertension: Secondary | ICD-10-CM

## 2017-09-27 ENCOUNTER — Telehealth: Payer: Self-pay | Admitting: Physician Assistant

## 2017-09-27 NOTE — Telephone Encounter (Signed)
Printed Medical Records from 2013 to 2018 and gave to patient

## 2017-09-28 ENCOUNTER — Other Ambulatory Visit: Payer: Self-pay | Admitting: Physician Assistant

## 2017-09-28 DIAGNOSIS — I10 Essential (primary) hypertension: Secondary | ICD-10-CM

## 2017-09-29 NOTE — Telephone Encounter (Signed)
Patient has not been seen since 2017 and patient picked up medical records 09/27/17.
